# Patient Record
Sex: Male | Born: 2005 | Race: White | Hispanic: No | Marital: Single
Health system: Southern US, Community
[De-identification: ages and names within clinical notes are randomized; demographics above are authoritative.]

## PROBLEM LIST (undated history)

## (undated) DIAGNOSIS — D689 Coagulation defect, unspecified: Secondary | ICD-10-CM

## (undated) DIAGNOSIS — T7840XA Allergy, unspecified, initial encounter: Secondary | ICD-10-CM

## (undated) DIAGNOSIS — F909 Attention-deficit hyperactivity disorder, unspecified type: Secondary | ICD-10-CM

## (undated) DIAGNOSIS — H6690 Otitis media, unspecified, unspecified ear: Secondary | ICD-10-CM

## (undated) DIAGNOSIS — R040 Epistaxis: Secondary | ICD-10-CM

## (undated) HISTORY — PX: ADENOIDECTOMY W/ MYRINGOTOMY: SHX1128

## (undated) HISTORY — PX: CIRCUMCISION: SUR203

## (undated) HISTORY — PX: TYMPANOSTOMY TUBE PLACEMENT: SHX32

## (undated) HISTORY — PX: ADENOIDECTOMY: SUR15

---
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
## 2024-01-30 DIAGNOSIS — Z419 Encounter for procedure for purposes other than remedying health state, unspecified: Secondary | ICD-10-CM | POA: Diagnosis not present

## 2024-02-04 DIAGNOSIS — F902 Attention-deficit hyperactivity disorder, combined type: Secondary | ICD-10-CM | POA: Diagnosis not present

## 2024-02-04 DIAGNOSIS — F4010 Social phobia, unspecified: Secondary | ICD-10-CM | POA: Diagnosis not present
